# Patient Record
Sex: Male | Born: 1990
Health system: Southern US, Community
[De-identification: ages and names within clinical notes are randomized; demographics above are authoritative.]

## PROBLEM LIST (undated history)

## (undated) DIAGNOSIS — C73 Malignant neoplasm of thyroid gland: Secondary | ICD-10-CM

## (undated) HISTORY — PX: TONSILLECTOMY: SUR1361

## (undated) HISTORY — DX: Malignant neoplasm of thyroid gland: C73

## (undated) HISTORY — PX: TOTAL THYROIDECTOMY: SHX2547

---

## 2016-10-17 DIAGNOSIS — E039 Hypothyroidism, unspecified: Secondary | ICD-10-CM | POA: Diagnosis not present

## 2016-10-17 DIAGNOSIS — C73 Malignant neoplasm of thyroid gland: Secondary | ICD-10-CM | POA: Diagnosis not present

## 2016-10-18 DIAGNOSIS — C73 Malignant neoplasm of thyroid gland: Secondary | ICD-10-CM | POA: Diagnosis not present

## 2016-10-18 DIAGNOSIS — E039 Hypothyroidism, unspecified: Secondary | ICD-10-CM | POA: Diagnosis not present

## 2016-10-19 DIAGNOSIS — E039 Hypothyroidism, unspecified: Secondary | ICD-10-CM | POA: Diagnosis not present

## 2016-10-19 DIAGNOSIS — C73 Malignant neoplasm of thyroid gland: Secondary | ICD-10-CM | POA: Diagnosis not present

## 2016-10-21 DIAGNOSIS — E039 Hypothyroidism, unspecified: Secondary | ICD-10-CM | POA: Diagnosis not present

## 2016-10-21 DIAGNOSIS — C73 Malignant neoplasm of thyroid gland: Secondary | ICD-10-CM | POA: Diagnosis not present

## 2017-01-24 ENCOUNTER — Encounter: Payer: Self-pay | Admitting: "Endocrinology

## 2017-01-24 DIAGNOSIS — E890 Postprocedural hypothyroidism: Secondary | ICD-10-CM | POA: Diagnosis not present

## 2017-01-24 DIAGNOSIS — Z85850 Personal history of malignant neoplasm of thyroid: Secondary | ICD-10-CM | POA: Diagnosis not present

## 2018-02-27 ENCOUNTER — Encounter: Payer: Self-pay | Admitting: "Endocrinology

## 2018-02-27 ENCOUNTER — Ambulatory Visit (INDEPENDENT_AMBULATORY_CARE_PROVIDER_SITE_OTHER): Payer: BLUE CROSS/BLUE SHIELD | Admitting: "Endocrinology

## 2018-02-27 VITALS — BP 111/77 | HR 74 | Ht 75.0 in | Wt 236.0 lb

## 2018-02-27 DIAGNOSIS — Z85850 Personal history of malignant neoplasm of thyroid: Secondary | ICD-10-CM | POA: Insufficient documentation

## 2018-02-27 DIAGNOSIS — E890 Postprocedural hypothyroidism: Secondary | ICD-10-CM | POA: Diagnosis not present

## 2018-02-27 MED ORDER — LEVOTHYROXINE SODIUM 175 MCG PO TABS: 175.0000 ug | ORAL_TABLET | Freq: Every day | ORAL | 1 refills | Status: DC

## 2018-02-27 NOTE — Progress Notes (Signed)
Endocrinology Consult Note                                         02/27/2018, 3:22 PM   Barry Rose is a 28 y.o.-year-old male patient being seen in consultation for history of thyroid malignancy status post surgery and I-131 thyroid remnant ablation from 2016-2018, postsurgical hypothyroidism .  He is referred by his previous endocrinologist from [facility] due to his relocation to this area.  His referral package contains a report of his surgical history as follows:  " 1) papillary thyroid cancer: He was diagnosed with Graves' disease in 2016 and was found to have a thyroid nodule on the left superior pole as part of evaluation.  Fine-needle aspiration of this nodule was suspicious.  Papillary thyroid cancer. September 2016 he had a total thyroidectomy on September 23, 2014.  Pathology indicated that the left lobe had 1.5 cm papillary thyroid cancer; right lobe had a 0.1 cm Micro Focus of papillary thyroid cancer; 17/20 central neck lymph nodes had papillary thyroid cancer involvement.  The cancer was negative for BRAF mutation. - December 2016: He had I-123 uptake that were elevated at 16.4%, subsequent I-131 ablation was deferred. April 2017: On April 14, 2015, he had further surgery with resection of his residual right thyroid lobe.  Pathology was reported to be benign. - May 2017: On May 27, 2015, I 123 uptake was 1.7%.  He received Thyrogen for Thyrogen stimulated remnant ablation on the week of 06/05/2015 and received 50.4 mCi of I-131.    On June 12, 2015 he underwent a post remnant ablation whole-body scan that had focal uptake in the thyroid bed and involvement of his lingual thyroid tissue. - October 2018: In the week of October 17, 2016, he underwent Thyrogen stimulated whole-body scan done at an outside institute.  His maximum TSH was 47.7 on October 18, 2016 with a negative whole-body scan.  He did not have the  stimulated thyroglobulin level drawn."   On January 26, 2017 his thyroglobulin level was <0.1, along with thyroglobulin antibody <1.0.  For his postsurgical hypothyroidism, he is currently on levothyroxine 175 mcg p.o. every morning.  Patient reports compliance.  Does not have recent thyroid function test to review.  He did have evidence of overtreatment in January 2019 at which time he was taking levothyroxine 200 mcg p.o. nightly.  -Patient does not have any acute symptoms nor complaints today.  He does not feel any masses nor nodules in the neck.  He denies hoarseness, dysphagia, odynophagia.  He denies any family history of thyroid dysfunction nor thyroid malignancy.  Past Medical History:  Diagnosis Date  . Thyroid cancer [facility])    Past Surgical History:  Procedure Laterality Date  . TONSILLECTOMY    . TOTAL THYROIDECTOMY     Social History   Socioeconomic History  . Marital status: Married    Spouse name: Not on file  . Number of children: Not  on file  . Years of education: Not on file  . Highest education level: Not on file  Occupational History  . Not on file  Social Needs  . Financial resource strain: Not on file  . Food insecurity:    Worry: Not on file    Inability: Not on file  . Transportation needs:    Medical: Not on file    Non-medical: Not on file  Tobacco Use  . Smoking status: Never Smoker  . Smokeless tobacco: Never Used  Substance and Sexual Activity  . Alcohol use: Yes    Comment: Occassionally  . Drug use: Never  . Sexual activity: Not on file  Lifestyle  . Physical activity:    Days per week: Not on file    Minutes per session: Not on file  . Stress: Not on file  Relationships  . Social connections:    Talks on phone: Not on file    Gets together: Not on file    Attends religious service: Not on file    Active member of club or organization: Not on file    Attends meetings of clubs or organizations: Not on file    Relationship status: Not  on file  Other Topics Concern  . Not on file  Social History Narrative  . Not on file   Outpatient Encounter Medications as of 02/27/2018  Medication Sig  . levothyroxine (UNITHROID) 175 MCG tablet Take 1 tablet (175 mcg total) by mouth daily before breakfast.  . [DISCONTINUED] levothyroxine (UNITHROID) 175 MCG tablet Take 175 mcg by mouth daily before breakfast.   No facility-administered encounter medications on file as of 02/27/2018.    ALLERGIES: Allergies  Allergen Reactions  . Sulfa Antibiotics   . Vancomycin    VACCINATION STATUS:  There is no immunization history on file for this patient.    ROS:  Constitutional: no weight gain/loss, no fatigue, no subjective hyperthermia, no subjective hypothermia Eyes: no blurry vision, no xerophthalmia ENT: no sore throat, no nodules palpated in throat, no dysphagia/odynophagia, no hoarseness Cardiovascular: no Chest Pain, no Shortness of Breath, no palpitations, no leg swelling Respiratory: no cough, no SOB Gastrointestinal: no Nausea/Vomiting/Diarhhea Musculoskeletal: no muscle/joint aches Skin: no rashes Neurological: no tremors, no numbness, no tingling, no dizziness Psychiatric: no depression, no anxiety   Physical Exam: BP 111/77   Pulse 74   Ht _0  (1.905 m)   Wt 236 lb (107 kg)   BMI 29.50 kg/m  Wt Readings from Last 3 Encounters:  02/27/18 236 lb (107 kg)    Constitutional:  + Over weight for height, not in acute distress, normal state of mind Eyes: PERRLA, EOMI, no exophthalmos ENT: moist mucous membranes, + post thyroidectomy scar on anterior lower neck,  nocervical lymphadenopathy Cardiovascular: normal precordial activity, Regular Rate and Rhythm, no Murmur/Rubs/Gallops Respiratory:  adequate breathing efforts, no gross chest deformity, Clear to auscultation bilaterally Gastrointestinal: abdomen soft, Non -tender, No distension, Bowel Sounds present Musculoskeletal: no gross deformities, strength intact  in all four extremities Skin: moist, warm, no rashes Neurological: no tremor with outstretched hands, Deep tendon reflexes normal in all four extremities.  Labs from January 24, 2017: TSH 1.52, free T4 2-0 3 (normal 0.9 3-1.7); thyroglobulin level ,0.01, thyroglobulin antibodies  <1.0  October 21, 2016 Thyrogen stimulated whole-body scan was negative for tumor recurrence.  ASSESSMENT: 1.  Postsurgical hypothyroidism 2.  History of thyroid malignancy   PLAN:    Patient with history of thyroid malignancy status post staged surgery  in 2016, see above for details of his surgery and pathology from his referral package.  -We will attempt to obtain the original copies of his surgery and pathology from the various hospitals he was treated at.   -The need for periodic surveillance with imaging studies is emphasized to him and he agrees. -It is time to obtain thyroid/neck ultrasound, which I have ordered for him to obtain prior to his next visit.  -I will proceed to obtain new set of thyroid function tests including TSH, free T4, free T3.  For his postsurgical hypothyroidism, his current dose of levothyroxine 175 mcg seems to be adequate and appropriate dose. -Until his next labs, he is advised to continue the same.   - We discussed about the correct intake of his thyroid hormone, on empty stomach at fasting, with water, separated by at least 30 minutes from breakfast and other medications,  and separated by more than 4 hours from calcium, iron, multivitamins, acid reflux medications (PPIs). -Patient is made aware of the fact that thyroid hormone replacement is needed for life, dose to be adjusted by periodic monitoring of thyroid function tests.  - Time spent with the patient: 35 minutes, of which >50% was spent in obtaining information about his symptoms, reviewing his previous labs, evaluations, and treatments, counseling him about his postsurgical hypothyroidism, history of thyroid malignancy,  and developing a plan to confirm the diagnosis and long term treatment as necessary.  Leana Gamer participated in the discussions, expressed understanding, and voiced agreement with the above plans.  All questions were answered to his satisfaction. he is encouraged to contact clinic should he have any questions or concerns prior to his return visit.  Return in about 5 weeks (around 04/03/2018), or Obtain his original surgical report from [facility] , for Thyroid / Neck Ultrasound, Follow up with Pre-visit Labs.  Glade Lloyd, MD [facility] [address] [place], [place] [address] Phone: [phone]  Fax: [phone]   02/27/2018, 3:22 PM  This note was partially dictated with voice recognition software. Similar sounding words can be transcribed inadequately or may not  be corrected upon review.

## 2018-03-07 ENCOUNTER — Telehealth: Payer: Self-pay | Admitting: "Endocrinology

## 2018-03-07 NOTE — Telephone Encounter (Signed)
[facility] has attempted to contact pt several times. No answer and no response to emails.

## 2018-04-10 ENCOUNTER — Encounter: Payer: BLUE CROSS/BLUE SHIELD | Admitting: "Endocrinology

## 2018-09-19 ENCOUNTER — Telehealth: Payer: Self-pay | Admitting: "Endocrinology

## 2018-09-19 NOTE — Telephone Encounter (Signed)
He gets 30 days, needs labs and a visit

## 2018-09-25 NOTE — Telephone Encounter (Signed)
Lab order sent to [facility]. Pt had requested this earlier

## 2018-09-25 NOTE — Telephone Encounter (Signed)
Patient would like to go ahead and get his ultra sound. Please send to [facility]. He is advised no further refills until labs and appt.

## 2018-10-23 ENCOUNTER — Other Ambulatory Visit: Payer: Self-pay | Admitting: "Endocrinology

## 2018-10-31 ENCOUNTER — Encounter: Payer: Self-pay | Admitting: "Endocrinology

## 2018-10-31 ENCOUNTER — Ambulatory Visit (INDEPENDENT_AMBULATORY_CARE_PROVIDER_SITE_OTHER): Payer: BC Managed Care – PPO | Admitting: "Endocrinology

## 2018-10-31 ENCOUNTER — Other Ambulatory Visit: Payer: Self-pay

## 2018-10-31 DIAGNOSIS — Z85850 Personal history of malignant neoplasm of thyroid: Secondary | ICD-10-CM

## 2018-10-31 DIAGNOSIS — E890 Postprocedural hypothyroidism: Secondary | ICD-10-CM | POA: Diagnosis not present

## 2018-10-31 NOTE — Progress Notes (Signed)
10/31/2018, 6:56 PM                                Endocrinology Telehealth Visit Follow up Note -During COVID -19 Pandemic  I connected with Barry Rose on 10/31/2018   by telephone and verified that I am speaking with the correct person using two identifiers. Barry Rose, [birth year]. he has verbally consented to this visit. All issues noted in this document were discussed and addressed. The format was not optimal for physical exam.   Barry Rose is a 28 y.o.-year-old male patient being engaged in telehealth via telephone in follow-up after he was seen in referral from another endocrinology  for history of thyroid malignancy status post surgery and I-131 thyroid remnant ablation from 2016-2018, postsurgical hypothyroidism .  He is referred by his previous endocrinologist from [facility] due to his relocation to this area.  His referral package contains a report of his surgical history as follows:  " 1) papillary thyroid cancer: He was diagnosed with Graves' disease in 2016 and was found to have a thyroid nodule on the left superior pole as part of evaluation.  Fine-needle aspiration of this nodule was suspicious.  Papillary thyroid cancer. September 2016 he had a total thyroidectomy on September 23, 2014.  Pathology indicated that the left lobe had 1.5 cm papillary thyroid cancer; right lobe had a 0.1 cm Micro Focus of papillary thyroid cancer; 17/20 central neck lymph nodes had papillary thyroid cancer involvement.  The cancer was negative for BRAF mutation. - December 2016: He had I-123 uptake that were elevated at 16.4%, subsequent I-131 ablation was deferred. April 2017: On April 14, 2015, he had further surgery with resection of his residual right thyroid lobe.  Pathology was reported to be benign. - May 2017: On May 27, 2015, I 123 uptake was 1.7%.  He received  Thyrogen for Thyrogen stimulated remnant ablation on the week of 06/05/2015 and received 50.4 mCi of I-131.    On June 12, 2015 he underwent a post remnant ablation whole-body scan that had focal uptake in the thyroid bed and involvement of his lingual thyroid tissue. - October 2018: In the week of October 17, 2016, he underwent Thyrogen stimulated whole-body scan done at an outside institute.  His maximum TSH was 47.7 on October 18, 2016 with a negative whole-body scan.  He did not have the stimulated thyroglobulin level drawn."  He underwent thyroid/neck ultrasound on October 02, 2018 in [place]  with no significant sonographic findings.    On January 26, 2017 his thyroglobulin level was <0.1, along with thyroglobulin antibody <1.0.  For his postsurgical hypothyroidism, he is currently on levothyroxine 175 mcg p.o. every morning.  Patient reports compliance.  He did not do his previsit  thyroid function test.  -Patient does not have any acute symptoms nor complaints today.  He does not feel any masses nor nodules in the neck.  He denies hoarseness, dysphagia, odynophagia.  He denies any family history of thyroid dysfunction nor thyroid malignancy.  Past Medical History:  Diagnosis Date  . Thyroid cancer [facility])    Past Surgical History:  Procedure Laterality Date  . TONSILLECTOMY    . TOTAL THYROIDECTOMY     Social History   Socioeconomic History  . Marital status: Married    Spouse name: Not on file  . Number of children: Not on file  . Years of education: Not on file  . Highest education level: Not on file  Occupational History  . Not on file  Social Needs  . Financial resource strain: Not on file  . Food insecurity    Worry: Not on file    Inability: Not on file  . Transportation needs    Medical: Not on file    Non-medical: Not on file  Tobacco Use  . Smoking status: Never Smoker  . Smokeless tobacco: Never Used  Substance and Sexual Activity  . Alcohol use:  Yes    Comment: Occassionally  . Drug use: Never  . Sexual activity: Not on file  Lifestyle  . Physical activity    Days per week: Not on file    Minutes per session: Not on file  . Stress: Not on file  Relationships  . Social Herbalist on phone: Not on file    Gets together: Not on file    Attends religious service: Not on file    Active member of club or organization: Not on file    Attends meetings of clubs or organizations: Not on file    Relationship status: Not on file  Other Topics Concern  . Not on file  Social History Narrative  . Not on file   Outpatient Encounter Medications as of 10/31/2018  Medication Sig  . levothyroxine (SYNTHROID) 175 MCG tablet TAKE 1 TABLET BY MOUTH DAILY BEFORE BREAKFAST   No facility-administered encounter medications on file as of 10/31/2018.    ALLERGIES: Allergies  Allergen Reactions  . Sulfa Antibiotics   . Vancomycin    VACCINATION STATUS:  There is no immunization history on file for this patient.    ROS: Limited as above.  Physical Exam: There were no vitals taken for this visit. Wt Readings from Last 3 Encounters:  02/27/18 236 lb (107 kg)     Labs from January 24, 2017: TSH 1.52, free T4 2-0 3 (normal 0.9 3-1.7); thyroglobulin level ,0.01, thyroglobulin antibodies  <1.0  October 21, 2016 Thyrogen stimulated whole-body scan was negative for tumor recurrence.  ASSESSMENT: 1.  Postsurgical hypothyroidism 2.  History of thyroid malignancy   Thyroid/neck ultrasound on October 02, 2018: There is no residual thyroid tissue noted within the thyroid bed.  No suspicious masses are evident by ultrasound.  No significant sonographic findings. PLAN:    Patient with history of thyroid malignancy status post staged surgery in 2016, see above for details of his surgery and pathology from his referral package.  -We will attempt to obtain the original copies of his surgery and pathology from the various hospitals  he was treated at.   -The need for periodic surveillance with imaging studies is emphasized to him and he agrees. -His recent  thyroid/neck ultrasound is negative for residual thyroid tissue or suspicious masses.  He will be  considered for Thyrogen stimulated whole-body scan next year.   -He no showed for several visits, the need for strict follow-up for surveillance is emphasized with him.   For his postsurgical hypothyroidism, his current dose of levothyroxine 175 mcg seems to be adequate and appropriate dose. -He is urged to get a new set of thyroid function tests in the next few days, and prior to his next visit in 6 months.  -Until his next labs, he is advised to continue the same.   - We discussed about the correct intake of his thyroid hormone, on empty stomach at fasting, with water, separated by at least 30 minutes from breakfast and other medications,  and separated by more than 4 hours from calcium, iron, multivitamins, acid reflux medications (PPIs). -Patient is made aware of the fact that thyroid hormone replacement is needed for life, dose to be adjusted by periodic monitoring of thyroid function tests.    Time for this visit: 25 minutes. Barry Rose  participated in the discussions, expressed understanding, and voiced agreement with the above plans.  All questions were answered to his satisfaction. he is encouraged to contact clinic should he have any questions or concerns prior to his return visit.   Return in about 6 months (around 05/01/2019), or labs now, and B4 next visit, for Follow up with Pre-visit Labs.  Glade Lloyd, MD [facility] [address] [place], [place] [address] Phone: [phone]  Fax: [phone]   10/31/2018, 6:56 PM  This note was partially dictated with voice recognition software. Similar sounding words can be transcribed inadequately or may not  be corrected upon review.

## 2018-11-30 ENCOUNTER — Other Ambulatory Visit: Payer: Self-pay | Admitting: "Endocrinology

## 2018-11-30 NOTE — Telephone Encounter (Signed)
Pt is requesting 90 day supply on his thyroid medication.

## 2019-01-01 ENCOUNTER — Other Ambulatory Visit: Payer: Self-pay | Admitting: "Endocrinology

## 2019-01-21 ENCOUNTER — Other Ambulatory Visit: Payer: Self-pay

## 2019-01-21 MED ORDER — LEVOTHYROXINE SODIUM 175 MCG PO TABS: 175.0000 ug | ORAL_TABLET | Freq: Every day | ORAL | 1 refills | Status: DC

## 2019-04-04 ENCOUNTER — Other Ambulatory Visit: Payer: Self-pay | Admitting: "Endocrinology

## 2019-05-02 ENCOUNTER — Ambulatory Visit: Payer: BC Managed Care – PPO | Admitting: "Endocrinology

## 2019-10-21 ENCOUNTER — Telehealth: Payer: Self-pay

## 2019-10-21 NOTE — Telephone Encounter (Signed)
Please advise 

## 2019-10-21 NOTE — Telephone Encounter (Signed)
Patient is asking for a refill on thyroid medication. Last ov was 10/2018. He was mailed a lab order today and will call back for an appt

## 2019-10-22 ENCOUNTER — Other Ambulatory Visit: Payer: Self-pay

## 2019-10-22 DIAGNOSIS — E890 Postprocedural hypothyroidism: Secondary | ICD-10-CM

## 2019-10-22 MED ORDER — LEVOTHYROXINE SODIUM 175 MCG PO TABS: ORAL_TABLET | ORAL | 0 refills | Status: DC

## 2019-10-22 NOTE — Telephone Encounter (Signed)
One month supply until we know his labs and a visit.

## 2019-10-22 NOTE — Telephone Encounter (Signed)
Sent in for 30 day supply

## 2019-11-21 ENCOUNTER — Other Ambulatory Visit (HOSPITAL_COMMUNITY)
Admission: RE | Admit: 2019-11-21 | Discharge: 2019-11-21 | Disposition: A | Discharge status: Home / Self Care | Payer: BC Managed Care – PPO | Source: Ambulatory Visit | Attending: "Endocrinology | Admitting: "Endocrinology

## 2019-11-21 DIAGNOSIS — E890 Postprocedural hypothyroidism: Secondary | ICD-10-CM | POA: Diagnosis present

## 2019-11-21 LAB — T4, FREE: Free T4: 1.33 ng/dL — ABNORMAL HIGH (ref 0.61–1.12)

## 2019-11-21 LAB — TSH: TSH: 1.26 u[IU]/mL (ref 0.350–4.500)

## 2019-11-25 ENCOUNTER — Other Ambulatory Visit: Payer: Self-pay

## 2019-11-25 DIAGNOSIS — E890 Postprocedural hypothyroidism: Secondary | ICD-10-CM

## 2019-11-25 MED ORDER — LEVOTHYROXINE SODIUM 175 MCG PO TABS: ORAL_TABLET | ORAL | 0 refills | Status: DC

## 2019-11-25 NOTE — Telephone Encounter (Signed)
Sent in

## 2019-11-25 NOTE — Telephone Encounter (Signed)
Pt asking for a refill. He did labs and will have an appt Friday 11/19. Advised pt he has to keep appt.

## 2019-11-27 ENCOUNTER — Other Ambulatory Visit: Payer: Self-pay | Admitting: "Endocrinology

## 2019-11-27 DIAGNOSIS — E890 Postprocedural hypothyroidism: Secondary | ICD-10-CM

## 2019-11-29 ENCOUNTER — Ambulatory Visit (INDEPENDENT_AMBULATORY_CARE_PROVIDER_SITE_OTHER): Payer: BC Managed Care – PPO | Admitting: "Endocrinology

## 2019-11-29 ENCOUNTER — Other Ambulatory Visit: Payer: Self-pay

## 2019-11-29 ENCOUNTER — Encounter: Payer: Self-pay | Admitting: "Endocrinology

## 2019-11-29 VITALS — BP 110/78 | HR 72 | Ht 72.0 in | Wt 239.6 lb

## 2019-11-29 DIAGNOSIS — E890 Postprocedural hypothyroidism: Secondary | ICD-10-CM

## 2019-11-29 DIAGNOSIS — Z85850 Personal history of malignant neoplasm of thyroid: Secondary | ICD-10-CM | POA: Diagnosis not present

## 2019-11-29 MED ORDER — LEVOTHYROXINE SODIUM 175 MCG PO TABS: ORAL_TABLET | ORAL | 1 refills | Status: DC

## 2019-11-29 NOTE — Progress Notes (Signed)
11/29/2019, 1:05 PM    Endocrinology follow-up note    Barry Rose is a 29 y.o.-year-old male patient being seen in follow-up after he was seen in referral from another endocrinologist  for history of thyroid malignancy status post surgery and I-131 thyroid remnant ablation from 2016-2018, postsurgical hypothyroidism .  He is referred by his previous endocrinologist from [facility] due to his relocation to this area.  His referral package contains a report of his surgical history as follows:  " 1) papillary thyroid cancer: He was diagnosed with Graves' disease in 2016 and was found to have a thyroid nodule on the left superior pole as part of evaluation.  Fine-needle aspiration of this nodule was suspicious.  Papillary thyroid cancer. September 2016 he had a total thyroidectomy on September 23, 2014.  Pathology indicated that the left lobe had 1.5 cm papillary thyroid cancer; right lobe had a 0.1 cm Micro Focus of papillary thyroid cancer; 17/20 central neck lymph nodes had papillary thyroid cancer involvement.  The cancer was negative for BRAF mutation. - December 2016: He had I-123 uptake that were elevated at 16.4%, subsequent I-131 ablation was deferred. April 2017: On April 14, 2015, he had further surgery with resection of his residual right thyroid lobe.  Pathology was reported to be benign. - May 2017: On May 27, 2015, I 123 uptake was 1.7%.  He received Thyrogen for Thyrogen stimulated remnant ablation on the week of 06/05/2015 and received 50.4 mCi of I-131.    On June 12, 2015 he underwent a post remnant ablation whole-body scan that had focal uptake in the thyroid bed and involvement of his lingual thyroid tissue. - October 2018: In the week of October 17, 2016, he underwent Thyrogen stimulated whole-body scan done at an outside institute.  His maximum TSH was 47.7 on  October 18, 2016 with a negative whole-body scan.  He did not have the stimulated thyroglobulin level drawn."  He underwent thyroid/neck ultrasound on October 02, 2018 in [place]  with no significant sonographic findings. -He presents with thyroid function test consistent with appropriate suppressive treatment.    On January 26, 2017 his thyroglobulin level was <0.1, along with thyroglobulin antibody <1.0.  For his postsurgical hypothyroidism, he is currently on levothyroxine 175 mcg p.o. every morning.  He reports compliance to his hormone replacement treatment, does not keep his appointment.     -Patient does not have any acute symptoms nor complaints today.  He does not feel any masses nor nodules in the neck.  He denies hoarseness, dysphagia, odynophagia.  He denies any family history of thyroid dysfunction nor thyroid malignancy.  Past Medical History:  Diagnosis Date  . Thyroid cancer [facility])    Past Surgical History:  Procedure Laterality Date  . TONSILLECTOMY    . TOTAL THYROIDECTOMY  Social History   Socioeconomic History  . Marital status: Married    Spouse name: Not on file  . Number of children: Not on file  . Years of education: Not on file  . Highest education level: Not on file  Occupational History  . Not on file  Tobacco Use  . Smoking status: Never Smoker  . Smokeless tobacco: Never Used  Vaping Use  . Vaping Use: Never used  Substance and Sexual Activity  . Alcohol use: Yes    Comment: Occassionally  . Drug use: Never  . Sexual activity: Not on file  Other Topics Concern  . Not on file  Social History Narrative  . Not on file   Social Determinants of Health   Financial Resource Strain:   . Difficulty of Paying Living Expenses: Not on file  Food Insecurity:   . Worried About Charity fundraiser in the Last Year: Not on file  . Ran Out of Food in the Last Year: Not on file  Transportation Needs:   . Lack of Transportation (Medical):  Not on file  . Lack of Transportation (Non-Medical): Not on file  Physical Activity:   . Days of Exercise per Week: Not on file  . Minutes of Exercise per Session: Not on file  Stress:   . Feeling of Stress : Not on file  Social Connections:   . Frequency of Communication with Friends and Family: Not on file  . Frequency of Social Gatherings with Friends and Family: Not on file  . Attends Religious Services: Not on file  . Active Member of Clubs or Organizations: Not on file  . Attends Archivist Meetings: Not on file  . Marital Status: Not on file   Outpatient Encounter Medications as of 11/29/2019  Medication Sig  . levothyroxine (SYNTHROID) 175 MCG tablet TAKE 1 TABLET BY MOUTH DAILY BEFORE BREAKFAST  . [DISCONTINUED] levothyroxine (SYNTHROID) 175 MCG tablet TAKE 1 TABLET BY MOUTH DAILY BEFORE BREAKFAST   No facility-administered encounter medications on file as of 11/29/2019.   ALLERGIES: Allergies  Allergen Reactions  . Sulfa Antibiotics   . Vancomycin    VACCINATION STATUS:  There is no immunization history on file for this patient.    ROS: Limited as above.  Physical Exam: BP 110/78   Pulse 72   Ht 6' (1.829 m)   Wt 239 lb 9.6 oz (108.7 kg)   BMI 32.50 kg/m  Wt Readings from Last 3 Encounters:  11/29/19 239 lb 9.6 oz (108.7 kg)  02/27/18 236 lb (107 kg)     Labs from January 24, 2017: TSH 1.52, free T4 2-0 3 (normal 0.9 3-1.7); thyroglobulin level ,0.01, thyroglobulin antibodies  <1.0  October 21, 2016 Thyrogen stimulated whole-body scan was negative for tumor recurrence.  ASSESSMENT: 1.  Postsurgical hypothyroidism 2.  History of thyroid malignancy   Thyroid/neck ultrasound on October 02, 2018: There is no residual thyroid tissue noted within the thyroid bed.  No suspicious masses are evident by ultrasound.  No significant sonographic findings. PLAN:    Patient with history of thyroid malignancy status post staged surgery in 2016, see  above for details of his surgery and pathology from his referral package.  -We will attempt to obtain the original copies of his surgery and pathology from the various hospitals he was treated at.   -The need for periodic surveillance with imaging studies is emphasized to him and he agrees. -His recent  thyroid/neck ultrasound is negative for residual thyroid  tissue or suspicious masses.   He will be considered for Thyrogen stimulated whole-body scan before his next visit in 6 months.  Next year.   -He no showed for several visits, the need for strict follow-up for surveillance is emphasized with him.   For his postsurgical hypothyroidism, his current dose of levothyroxine 175 mcg seems to be adequate and appropriate dose. -His previsit thyroid function tests are consistent with appropriate suppressive treatment. -He is urged to get a new set of thyroid function tests before his next visit in 6 months.     - We discussed about the correct intake of his thyroid hormone, on empty stomach at fasting, with water, separated by at least 30 minutes from breakfast and other medications,  and separated by more than 4 hours from calcium, iron, multivitamins, acid reflux medications (PPIs). -Patient is made aware of the fact that thyroid hormone replacement is needed for life, dose to be adjusted by periodic monitoring of thyroid function tests.  -He is advised to identify primary care provider in the area.      - Time spent on this patient care encounter:  20 minutes of which 50% was spent in  counseling and the rest reviewing  his current and  previous labs / studies and medications  doses and developing a plan for long term care. Leana Gamer  participated in the discussions, expressed understanding, and voiced agreement with the above plans.  All questions were answered to his satisfaction. he is encouraged to contact clinic should he have any questions or concerns prior to his return  visit.    Return in about 6 months (around 05/28/2020) for F/U with Pre-visit Labs, F/U with Whole Body Scan w/Thyrogen.  Glade Lloyd, MD [facility] [address] [place], [place] [address] Phone: [phone]  Fax: [phone]   11/29/2019, 1:05 PM  This note was partially dictated with voice recognition software. Similar sounding words can be transcribed inadequately or may not  be corrected upon review.

## 2020-05-13 ENCOUNTER — Encounter (HOSPITAL_COMMUNITY)
Admission: RE | Admit: 2020-05-13 | Discharge: 2020-05-13 | Disposition: A | Discharge status: Home / Self Care | Payer: BC Managed Care – PPO | Source: Ambulatory Visit | Attending: "Endocrinology | Admitting: "Endocrinology

## 2020-05-13 ENCOUNTER — Encounter (HOSPITAL_COMMUNITY): Payer: Self-pay

## 2020-05-13 DIAGNOSIS — Z85850 Personal history of malignant neoplasm of thyroid: Secondary | ICD-10-CM | POA: Diagnosis present

## 2020-05-13 DIAGNOSIS — E890 Postprocedural hypothyroidism: Secondary | ICD-10-CM | POA: Diagnosis not present

## 2020-05-13 MED ORDER — STERILE WATER FOR INJECTION IJ SOLN
INTRAMUSCULAR | Status: AC
  Administered 2020-05-13: 1 mL
  Filled 2020-05-13: qty 10

## 2020-05-13 MED ORDER — THYROTROPIN ALFA 0.9 MG IM SOLR
0.9000 mg | INTRAMUSCULAR | Status: AC
  Administered 2020-05-13: 0.9 mg via INTRAMUSCULAR

## 2020-05-13 MED ORDER — THYROTROPIN ALFA 0.9 MG IM SOLR
INTRAMUSCULAR | Status: AC
  Filled 2020-05-13: qty 0.9

## 2020-05-14 ENCOUNTER — Encounter (HOSPITAL_COMMUNITY)
Admission: RE | Admit: 2020-05-14 | Discharge: 2020-05-14 | Disposition: A | Discharge status: Home / Self Care | Payer: BC Managed Care – PPO | Source: Ambulatory Visit | Attending: "Endocrinology | Admitting: "Endocrinology

## 2020-05-14 DIAGNOSIS — E890 Postprocedural hypothyroidism: Secondary | ICD-10-CM | POA: Diagnosis not present

## 2020-05-14 DIAGNOSIS — Z85850 Personal history of malignant neoplasm of thyroid: Secondary | ICD-10-CM

## 2020-05-14 MED ORDER — THYROTROPIN ALFA 0.9 MG IM SOLR: 0.9000 mg | INTRAMUSCULAR | Status: AC

## 2020-05-14 MED ORDER — STERILE WATER FOR INJECTION IJ SOLN
INTRAMUSCULAR | Status: AC
  Administered 2020-05-14: 1 mL
  Filled 2020-05-14: qty 10

## 2020-05-14 MED ORDER — THYROTROPIN ALFA 0.9 MG IM SOLR
INTRAMUSCULAR | Status: AC
  Administered 2020-05-14: 0.9 mg via INTRAMUSCULAR
  Filled 2020-05-14: qty 0.9

## 2020-05-15 ENCOUNTER — Other Ambulatory Visit: Payer: Self-pay

## 2020-05-15 ENCOUNTER — Other Ambulatory Visit (HOSPITAL_COMMUNITY)
Admission: RE | Admit: 2020-05-15 | Discharge: 2020-05-15 | Disposition: A | Discharge status: Home / Self Care | Payer: BC Managed Care – PPO | Source: Ambulatory Visit | Attending: "Endocrinology | Admitting: "Endocrinology

## 2020-05-15 ENCOUNTER — Encounter (HOSPITAL_COMMUNITY)
Admission: RE | Admit: 2020-05-15 | Discharge: 2020-05-15 | Disposition: A | Discharge status: Home / Self Care | Payer: BC Managed Care – PPO | Source: Ambulatory Visit | Attending: "Endocrinology | Admitting: "Endocrinology

## 2020-05-15 ENCOUNTER — Encounter (HOSPITAL_COMMUNITY): Payer: Self-pay

## 2020-05-15 DIAGNOSIS — E890 Postprocedural hypothyroidism: Secondary | ICD-10-CM | POA: Diagnosis present

## 2020-05-15 DIAGNOSIS — Z85850 Personal history of malignant neoplasm of thyroid: Secondary | ICD-10-CM | POA: Insufficient documentation

## 2020-05-15 LAB — TSH: TSH: 89.367 u[IU]/mL — ABNORMAL HIGH (ref 0.350–4.500)

## 2020-05-15 LAB — T4, FREE: Free T4: 1.49 ng/dL — ABNORMAL HIGH (ref 0.61–1.12)

## 2020-05-15 MED ORDER — SODIUM IODIDE I 131 CAPSULE
4.0000 | Freq: Once | INTRAVENOUS | Status: AC | PRN
  Administered 2020-05-15: 4.15 via ORAL

## 2020-05-16 LAB — THYROGLOBULIN ANTIBODY: Thyroglobulin Antibody: <1 IU/mL (ref 0.0–0.9)

## 2020-05-18 ENCOUNTER — Encounter (HOSPITAL_COMMUNITY)
Admission: RE | Admit: 2020-05-18 | Discharge: 2020-05-18 | Disposition: A | Discharge status: Home / Self Care | Payer: BC Managed Care – PPO | Source: Ambulatory Visit | Attending: "Endocrinology | Admitting: "Endocrinology

## 2020-05-18 IMAGING — NM NM [ID] THYROID CANCER METS WHOLE BODY W/ THYROGEN
3 series · 3 of 3 positions shown · non-contrast
Comparison: None.

CLINICAL DATA: Thyroid cancer surveillance. Post thyroidectomy. The
thyroidectomy [LP] a papillary thyroid carcinoma. Thyroid remnant
ablation with 15 millicuries [DATE].

EXAM:
THYROGEN-STIMULATED [LP] WHOLE BODY SCAN
TECHNIQUE: The patient received 0.9 mg Thyrogen intramuscularly every 24 hours
for two doses. On the third day the patient returned and received
the radiopharmaceutical, per orally. On the fifth day, the patient
returned and whole body planar images were obtained in the anterior
and posterior projections.
RADIOPHARMACEUTICALS:  4.1 mCi [LP] sodium iodide orally

[Series 1: i131 whole body · 2.66mm/px · 1 of 1 slices shown (1 of 2)]
[im 1/1  full-range]
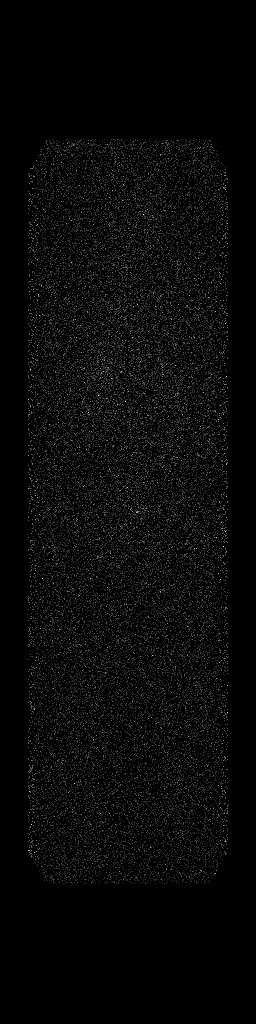

[Series 1: i131 whole body · 2.66mm/px · 1 of 1 slices shown (2 of 2)]
[im 1/1  full-range]
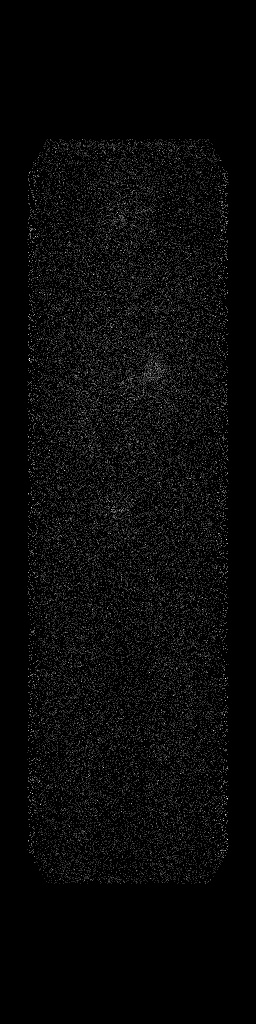

[Series 2: static with marker · 4.14mm/px · 1 of 1 slices shown]
[im 1/1]
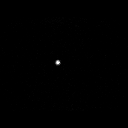

[3 of 3 positions shown; findings below may reference images not displayed]

FINDINGS: No a focal activity in the thyroid bed. No evidence abnormal
radiotracer activity outside the thyroid bed on whole-body scan.
Physiologic activity noted in the stomach and salivary glands.
IMPRESSION: No evidence of I 131 avid local thyroid cancer recurrence or distant
metastasis.

## 2020-05-24 LAB — THYROGLOBULIN LEVEL: Thyroglobulin: <2 ng/mL

## 2020-05-28 ENCOUNTER — Other Ambulatory Visit: Payer: Self-pay

## 2020-05-28 ENCOUNTER — Ambulatory Visit: Payer: BC Managed Care – PPO | Admitting: "Endocrinology

## 2020-05-28 ENCOUNTER — Encounter: Payer: Self-pay | Admitting: "Endocrinology

## 2020-05-28 VITALS — BP 117/83 | HR 71 | Ht 72.0 in | Wt 235.0 lb

## 2020-05-28 DIAGNOSIS — Z85850 Personal history of malignant neoplasm of thyroid: Secondary | ICD-10-CM | POA: Diagnosis not present

## 2020-05-28 DIAGNOSIS — E890 Postprocedural hypothyroidism: Secondary | ICD-10-CM | POA: Diagnosis not present

## 2020-05-28 MED ORDER — LEVOTHYROXINE SODIUM 175 MCG PO TABS: ORAL_TABLET | ORAL | 1 refills | Status: DC

## 2020-05-28 NOTE — Progress Notes (Signed)
05/28/2020, 9:36 PM    Endocrinology follow-up note    Barry Rose is a 30 y.o.-year-old male patient being seen in follow-up after he was seen in referral from another endocrinologist  for history of thyroid malignancy status post surgery and I-131 thyroid remnant ablation from 2016-2018, postsurgical hypothyroidism .  He was originally referred by his previous endocrinologist from [facility] due to his relocation to this area.  His referral package contains a report of his surgical history as follows:  " 1) papillary thyroid cancer: He was diagnosed with Graves' disease in 2016 and was found to have a thyroid nodule on the left superior pole as part of evaluation.  Fine-needle aspiration of this nodule was suspicious.  Papillary thyroid cancer. September 2016 he had a total thyroidectomy on September 23, 2014.  Pathology indicated that the left lobe had 1.5 cm papillary thyroid cancer; right lobe had a 0.1 cm Micro Focus of papillary thyroid cancer; 17/20 central neck lymph nodes had papillary thyroid cancer involvement.  The cancer was negative for BRAF mutation. - December 2016: He had I-123 uptake that were elevated at 16.4%, subsequent I-131 ablation was deferred. April 2017: On April 14, 2015, he had further surgery with resection of his residual right thyroid lobe.  Pathology was reported to be benign. - May 2017: On May 27, 2015, I 123 uptake was 1.7%.  He received Thyrogen for Thyrogen stimulated remnant ablation on the week of 06/05/2015 and received 50.4 mCi of I-131.    On June 12, 2015 he underwent a post remnant ablation whole-body scan that had focal uptake in the thyroid bed and involvement of his lingual thyroid tissue. - October 2018: In the week of October 17, 2016, he underwent Thyrogen stimulated whole-body scan done at an outside institute.  His maximum TSH  was 47.7 on October 18, 2016 with a negative whole-body scan.  He did not have the stimulated thyroglobulin level drawn."  He underwent thyroid/neck ultrasound on October 02, 2018 in [place]  with no significant sonographic findings. -He presents with thyroid function test consistent with appropriate suppressive treatment.  His most recent Thyrogen stimulated whole-body scan on May 18, 2020 was significant for absence of tumor recurrence or distant metastasis. Thyroglobulin level was under 2 on May 15, 2020 associated with undetectable thyroglobulin bodies.  For his postsurgical hypothyroidism, he is currently on levothyroxine 175 mcg p.o. every morning.  He reports compliance to his hormone replacement treatment.  He has history of consistency and keeping his appointments.  His previsit labs show high free T4 and high TSH.    -Patient does not have any acute symptoms nor complaints today.  He does not feel any masses nor nodules in the neck.  He denies hoarseness, dysphagia, odynophagia.  He denies any family history of thyroid dysfunction nor  thyroid malignancy.  Past Medical History:  Diagnosis Date  . Thyroid cancer [facility])    Past Surgical History:  Procedure Laterality Date  . TONSILLECTOMY    . TOTAL THYROIDECTOMY     Social History   Socioeconomic History  . Marital status: Married    Spouse name: Not on file  . Number of children: Not on file  . Years of education: Not on file  . Highest education level: Not on file  Occupational History  . Not on file  Tobacco Use  . Smoking status: Never Smoker  . Smokeless tobacco: Never Used  Vaping Use  . Vaping Use: Never used  Substance and Sexual Activity  . Alcohol use: Yes    Comment: Occassionally  . Drug use: Never  . Sexual activity: Not on file  Other Topics Concern  . Not on file  Social History Narrative  . Not on file   Social Determinants of Health   Financial Resource Strain: Not on file  Food  Insecurity: Not on file  Transportation Needs: Not on file  Physical Activity: Not on file  Stress: Not on file  Social Connections: Not on file   Outpatient Encounter Medications as of 05/28/2020  Medication Sig  . cetirizine (ZYRTEC) 10 MG tablet Take 10 mg by mouth daily.  Marland Kitchen levothyroxine (SYNTHROID) 175 MCG tablet TAKE 1 TABLET BY MOUTH DAILY BEFORE BREAKFAST  . [DISCONTINUED] levothyroxine (SYNTHROID) 175 MCG tablet TAKE 1 TABLET BY MOUTH DAILY BEFORE BREAKFAST   No facility-administered encounter medications on file as of 05/28/2020.   ALLERGIES: Allergies  Allergen Reactions  . Sulfa Antibiotics   . Vancomycin    VACCINATION STATUS:  There is no immunization history on file for this patient.    ROS: Limited as above.  Physical Exam: BP 117/83   Pulse 71   Ht 6' (1.829 m)   Wt 235 lb (106.6 kg)   BMI 31.87 kg/m  Wt Readings from Last 3 Encounters:  05/28/20 235 lb (106.6 kg)  11/29/19 239 lb 9.6 oz (108.7 kg)  02/27/18 236 lb (107 kg)     Labs from January 24, 2017: TSH 1.52, free T4 2-0 3 (normal 0.9 3-1.7); thyroglobulin level ,0.01, thyroglobulin antibodies  <1.0  October 21, 2016 Thyrogen stimulated whole-body scan was negative for tumor recurrence.  ASSESSMENT: 1.  Postsurgical hypothyroidism 2.  History of thyroid malignancy   Thyroid/neck ultrasound on October 02, 2018: There is no residual thyroid tissue noted within the thyroid bed.  No suspicious masses are evident by ultrasound.  No significant sonographic findings. PLAN:    Patient with history of thyroid malignancy status post staged surgery in 2016, see above for details of his surgery and pathology from his referral package.  -We will attempt to obtain the original copies of his surgery and pathology from the various hospitals he was treated at.   -The need for periodic surveillance with imaging studies is emphasized to him and he agrees. -His recent  thyroid/neck ultrasound is negative  for residual thyroid tissue or suspicious masses.  -His previsit thyroid estimated whole-body scan is negative for tumor recurrence or distant metastasis.  His thyroglobulin levels are undetectable along with undetectable thyroglobulin bodies.  He has a habit of not showing for his appointments.  The need for strict follow-up for surveillance is emphasized 1 more time for him.   For his postsurgical hypothyroidism, his current dose of levothyroxine 175 mcg seems to be adequate and appropriate dose. -His previsit thyroid function  tests are showing inconsistent intake with high free T4 and high TSH.  He will benefit from staying on the same dose of levothyroxine 175 mcg p.o. daily before breakfast.    - We discussed about the correct intake of his thyroid hormone, on empty stomach at fasting, with water, separated by at least 30 minutes from breakfast and other medications,  and separated by more than 4 hours from calcium, iron, multivitamins, acid reflux medications (PPIs). -Patient is made aware of the fact that thyroid hormone replacement is needed for life, dose to be adjusted by periodic monitoring of thyroid function tests. -He is advised to identify primary care provider in the area.    I spent 31 minutes in the care of the patient today including review of labs from Thyroid Function,  and other relevant labs ; nuclear medicine whole-body scan imaging/biopsy records (current and previous including abstractions from other facilities); face-to-face time discussing  his lab results and symptoms, medications doses, his options of short and long term treatment based on the latest standards of care / guidelines;   and documenting the encounter.  Leana Gamer  participated in the discussions, expressed understanding, and voiced agreement with the above plans.  All questions were answered to his satisfaction. he is encouraged to contact clinic should he have any questions or concerns prior to his  return visit.    Return in about 6 months (around 11/28/2020) for F/U with Pre-visit Labs.  Glade Lloyd, MD [facility] [address] [place], [place] [address] Phone: [phone]  Fax: [phone]   05/28/2020, 9:36 PM  This note was partially dictated with voice recognition software. Similar sounding words can be transcribed inadequately or may not  be corrected upon review.

## 2020-11-23 ENCOUNTER — Other Ambulatory Visit (HOSPITAL_COMMUNITY)
Admission: RE | Admit: 2020-11-23 | Discharge: 2020-11-23 | Disposition: A | Discharge status: Home / Self Care | Payer: BC Managed Care – PPO | Source: Ambulatory Visit | Attending: "Endocrinology | Admitting: "Endocrinology

## 2020-11-23 DIAGNOSIS — E890 Postprocedural hypothyroidism: Secondary | ICD-10-CM | POA: Diagnosis not present

## 2020-11-23 LAB — T4, FREE: Free T4: 1.09 ng/dL (ref 0.61–1.12)

## 2020-11-23 LAB — TSH: TSH: 0.768 u[IU]/mL (ref 0.350–4.500)

## 2020-12-09 ENCOUNTER — Other Ambulatory Visit: Payer: Self-pay

## 2020-12-09 ENCOUNTER — Encounter: Payer: Self-pay | Admitting: "Endocrinology

## 2020-12-09 ENCOUNTER — Ambulatory Visit: Payer: BC Managed Care – PPO | Admitting: "Endocrinology

## 2020-12-09 VITALS — BP 124/72 | HR 76 | Ht 72.0 in | Wt 238.0 lb

## 2020-12-09 DIAGNOSIS — Z85850 Personal history of malignant neoplasm of thyroid: Secondary | ICD-10-CM

## 2020-12-09 DIAGNOSIS — E890 Postprocedural hypothyroidism: Secondary | ICD-10-CM

## 2020-12-09 MED ORDER — LEVOTHYROXINE SODIUM 175 MCG PO TABS
ORAL_TABLET | ORAL | 1 refills | Status: DC
Start: 2020-12-09 — End: 2021-06-21

## 2020-12-09 NOTE — Progress Notes (Signed)
12/09/2020, 9:08 AM    Endocrinology follow-up note    Barry Rose is a 30 y.o.-year-old male patient being seen in follow-up after he was seen in referral from another endocrinologist  for history of thyroid malignancy status post surgery and I-131 thyroid remnant ablation from 2016-2018, postsurgical hypothyroidism .  He was originally referred by his previous endocrinologist from [facility] due to his relocation to this area.  His referral package contains a report of his surgical history as follows:  " 1) papillary thyroid cancer: He was diagnosed with Graves' disease in 2016 and was found to have a thyroid nodule on the left superior pole as part of evaluation.  Fine-needle aspiration of this nodule was suspicious.  Papillary thyroid cancer. September 2016 he had a total thyroidectomy on September 23, 2014.  Pathology indicated that the left lobe had 1.5 cm papillary thyroid cancer; right lobe had a 0.1 cm Micro Focus of papillary thyroid cancer; 17/20 central neck lymph nodes had papillary thyroid cancer involvement.  The cancer was negative for BRAF mutation. - December 2016: He had I-123 uptake that were elevated at 16.4%, subsequent I-131 ablation was deferred. April 2017: On April 14, 2015, he had further surgery with resection of his residual right thyroid lobe.  Pathology was reported to be benign. - May 2017: On May 27, 2015, I 123 uptake was 1.7%.  He received Thyrogen for Thyrogen stimulated remnant ablation on the week of 06/05/2015 and received 50.4 mCi of I-131.    On June 12, 2015 he underwent a post remnant ablation whole-body scan that had focal uptake in the thyroid bed and involvement of his lingual thyroid tissue. - October 2018: In the week of October 17, 2016, he underwent Thyrogen stimulated whole-body scan done at an outside institute.  His maximum TSH  was 47.7 on October 18, 2016 with a negative whole-body scan.  He did not have the stimulated thyroglobulin level drawn."  He underwent thyroid/neck ultrasound on October 02, 2018 in [place]  with no significant sonographic findings. -He presents with thyroid function test consistent with appropriate suppressive treatment.  His most recent Thyrogen stimulated whole-body scan on May 18, 2020 was significant for absence of tumor recurrence or distant metastasis. Thyroglobulin level was under 2 on May 15, 2020 associated with undetectable thyroglobulin bodies.  For his postsurgical hypothyroidism, he is currently on levo levothyroxine 175 mcg p.o. daily before breakfast.  He reports compliance to his medication, has no new complaints today.    His previsit thyroid function tests are consistent with appropriate replacement.    -Patient does not have any acute symptoms nor complaints today.  He does not feel any masses nor nodules in the neck.  He denies hoarseness, dysphagia, odynophagia.  He denies any family history of thyroid dysfunction nor thyroid malignancy.  Past Medical History:  Diagnosis Date   Thyroid cancer [facility])    Past Surgical History:  Procedure Laterality Date   TONSILLECTOMY     TOTAL THYROIDECTOMY     Social History   Socioeconomic History   Marital status: Married    Spouse name: Not on file   Number of children: Not on file   Years of education: Not on file   Highest education level: Not on file  Occupational History   Not on file  Tobacco Use   Smoking status: Never   Smokeless tobacco: Never  Vaping Use   Vaping Use: Never used  Substance and Sexual Activity   Alcohol use: Yes    Comment: Occassionally   Drug use: Never   Sexual activity: Not on file  Other Topics Concern   Not on file  Social History Narrative   Not on file   Social Determinants of Health   Financial Resource Strain: Not on file  Food Insecurity: Not on file   Transportation Needs: Not on file  Physical Activity: Not on file  Stress: Not on file  Social Connections: Not on file   Outpatient Encounter Medications as of 12/09/2020  Medication Sig   GLUCOSAMINE-CHONDROITIN PO Take by mouth daily in the afternoon.   Omega-3 Fatty Acids (FISH OIL OMEGA-3) 1000 MG CAPS Take 1 capsule by mouth daily in the afternoon.   cetirizine (ZYRTEC) 10 MG tablet Take 10 mg by mouth daily. (Patient not taking: Reported on 12/09/2020)   levothyroxine (SYNTHROID) 175 MCG tablet TAKE 1 TABLET BY MOUTH DAILY BEFORE BREAKFAST   [DISCONTINUED] levothyroxine (SYNTHROID) 175 MCG tablet TAKE 1 TABLET BY MOUTH DAILY BEFORE BREAKFAST   No facility-administered encounter medications on file as of 12/09/2020.   ALLERGIES: Allergies  Allergen Reactions   Sulfa Antibiotics    Vancomycin    VACCINATION STATUS:  There is no immunization history on file for this patient.    ROS: Limited as above.  Physical Exam: BP 124/72   Pulse 76   Ht 6' (1.829 m)   Wt 238 lb (108 kg)   BMI 32.28 kg/m  Wt Readings from Last 3 Encounters:  12/09/20 238 lb (108 kg)  05/28/20 235 lb (106.6 kg)  11/29/19 239 lb 9.6 oz (108.7 kg)     Labs from January 24, 2017: TSH 1.52, free T4 2-0 3 (normal 0.9 3-1.7); thyroglobulin level ,0.01, thyroglobulin antibodies  <1.0  October 21, 2016 Thyrogen stimulated whole-body scan was negative for tumor recurrence.  ASSESSMENT: 1.  Postsurgical hypothyroidism 2.  History of thyroid malignancy   Thyroid/neck ultrasound on October 02, 2018: There is no residual thyroid tissue noted within the thyroid bed.  No suspicious masses are evident by ultrasound.  No significant sonographic findings. PLAN:    Patient with history of thyroid malignancy status post staged surgery in 2016, see above for details of his surgery and pathology from his referral package.    -The need for periodic surveillance with imaging studies is emphasized to him  and he agrees. -His recent  thyroid/neck ultrasound is negative for residual thyroid tissue or suspicious masses.   -His previsit thyroid estimated whole-body scan is negative for tumor recurrence or distant metastasis.  His thyroglobulin levels are undetectable along with undetectable thyroglobulin bodies.  He has a habit of not showing for his appointments.  The need for strict follow-up for surveillance is emphasized 1 more time for him. He will be considered for 1 more imaging study after his next  visit in a year.  For his postsurgical hypothyroidism, his current dose of levothyroxine 175 mcg seems to be adequate and appropriate dose.   - We discussed about the correct intake of his thyroid hormone, on empty stomach at fasting, with water, separated by at least 30 minutes from breakfast and other medications,  and separated by more than 4 hours from calcium, iron, multivitamins, acid reflux medications (PPIs). -Patient is made aware of the fact that thyroid hormone replacement is needed for life, dose to be adjusted by periodic monitoring of thyroid function tests.  -He is advised to identify primary care provider in the area.   I spent 21 minutes in the care of the patient today including review of labs from Thyroid Function, CMP, and other relevant labs ; imaging/biopsy records (current and previous including abstractions from other facilities); face-to-face time discussing  his lab results and symptoms, medications doses, his options of short and long term treatment based on the latest standards of care / guidelines;   and documenting the encounter.  Leana Gamer  participated in the discussions, expressed understanding, and voiced agreement with the above plans.  All questions were answered to his satisfaction. he is encouraged to contact clinic should he have any questions or concerns prior to his return visit.    Return in about 1 year (around 12/09/2021) for F/U with Pre-visit  Labs.  Glade Lloyd, MD [facility] [address] [place], [place] [address] Phone: [phone]  Fax: [phone]   12/09/2020, 9:08 AM  This note was partially dictated with voice recognition software. Similar sounding words can be transcribed inadequately or may not  be corrected upon review.

## 2021-06-21 ENCOUNTER — Other Ambulatory Visit: Payer: Self-pay | Admitting: "Endocrinology

## 2021-06-21 DIAGNOSIS — E890 Postprocedural hypothyroidism: Secondary | ICD-10-CM

## 2021-11-10 ENCOUNTER — Telehealth: Payer: Self-pay | Admitting: "Endocrinology

## 2021-11-10 DIAGNOSIS — E890 Postprocedural hypothyroidism: Secondary | ICD-10-CM

## 2021-11-10 MED ORDER — LEVOTHYROXINE SODIUM 175 MCG PO TABS: ORAL_TABLET | ORAL | 0 refills | Status: DC

## 2021-11-10 NOTE — Telephone Encounter (Signed)
Pt requesting refill on her Thyroid Medication. Please send to [facility] on [address]

## 2021-11-10 NOTE — Telephone Encounter (Signed)
Rx sent 

## 2021-12-09 ENCOUNTER — Ambulatory Visit: Payer: BC Managed Care – PPO | Admitting: "Endocrinology

## 2022-01-05 ENCOUNTER — Other Ambulatory Visit: Payer: Self-pay | Admitting: "Endocrinology

## 2022-01-05 DIAGNOSIS — E890 Postprocedural hypothyroidism: Secondary | ICD-10-CM

## 2022-01-06 ENCOUNTER — Other Ambulatory Visit: Payer: Self-pay | Admitting: "Endocrinology

## 2022-01-07 LAB — TSH: TSH: 3.53 u[IU]/mL (ref 0.450–4.500)

## 2022-01-07 LAB — T4, FREE: Free T4: 1.56 ng/dL (ref 0.82–1.77)

## 2022-01-11 ENCOUNTER — Ambulatory Visit (INDEPENDENT_AMBULATORY_CARE_PROVIDER_SITE_OTHER): Payer: 59 | Admitting: "Endocrinology

## 2022-01-11 ENCOUNTER — Encounter: Payer: Self-pay | Admitting: "Endocrinology

## 2022-01-11 VITALS — BP 122/76 | HR 68 | Ht 75.0 in | Wt 240.4 lb

## 2022-01-11 DIAGNOSIS — E890 Postprocedural hypothyroidism: Secondary | ICD-10-CM

## 2022-01-11 DIAGNOSIS — Z85850 Personal history of malignant neoplasm of thyroid: Secondary | ICD-10-CM | POA: Diagnosis not present

## 2022-01-11 NOTE — Progress Notes (Signed)
01/11/2022, 4:04 PM    Endocrinology follow-up note    Barry Rose is a 32 y.o.-year-old male patient being seen in follow-up after he was seen in referral from another endocrinologist  for history of thyroid malignancy status post surgery and I-131 thyroid remnant ablation from 2016-2018, postsurgical hypothyroidism .  He was originally referred by his previous endocrinologist from [facility] due to his relocation to this area.  His referral package contains a report of his surgical history as follows:  " 1) papillary thyroid cancer: He was diagnosed with Graves' disease in 2016 and was found to have a thyroid nodule on the left superior pole as part of evaluation.  Fine-needle aspiration of this nodule was suspicious.  Papillary thyroid cancer. September 2016 he had a total thyroidectomy on September 23, 2014.  Pathology indicated that the left lobe had 1.5 cm papillary thyroid cancer; right lobe had a 0.1 cm Micro Focus of papillary thyroid cancer; 17/20 central neck lymph nodes had papillary thyroid cancer involvement.  The cancer was negative for BRAF mutation. - December 2016: He had I-123 uptake that were elevated at 16.4%, subsequent I-131 ablation was deferred. April 2017: On April 14, 2015, he had further surgery with resection of his residual right thyroid lobe.  Pathology was reported to be benign. - May 2017: On May 27, 2015, I 123 uptake was 1.7%.  He received Thyrogen for Thyrogen stimulated remnant ablation on the week of 06/05/2015 and received 50.4 mCi of I-131.    On June 12, 2015 he underwent a post remnant ablation whole-body scan that had focal uptake in the thyroid bed and involvement of his lingual thyroid tissue. - October 2018: In the week of October 17, 2016, he underwent Thyrogen stimulated whole-body scan done at an outside institute.  His maximum TSH  was 47.7 on October 18, 2016 with a negative whole-body scan.  He did not have the stimulated thyroglobulin level drawn."  He underwent thyroid/neck ultrasound on October 02, 2018 in [place]  with no significant sonographic findings. He has no new complaints today. -He presents with thyroid function test consistent with appropriate replacement therapy.    His most recent Thyrogen stimulated whole-body scan on May 18, 2020 was significant for absence of tumor recurrence or distant metastasis. Thyroglobulin level was under 2 on May 15, 2020 associated with undetectable thyroglobulin bodies.  For his postsurgical hypothyroidism, he is currently on levo levothyroxine 175 mcg p.o. daily before breakfast.  He reports very good compliance and consistency taking his medication.     -Patient does not have any acute symptoms nor complaints today.  He does not feel any masses nor nodules in the neck.  He denies hoarseness, dysphagia, odynophagia.  He denies any family history of thyroid dysfunction nor thyroid malignancy.  Past Medical History:  Diagnosis  Date   Thyroid cancer [facility])    Past Surgical History:  Procedure Laterality Date   TONSILLECTOMY     TOTAL THYROIDECTOMY     Social History   Socioeconomic History   Marital status: Married    Spouse name: Not on file   Number of children: Not on file   Years of education: Not on file   Highest education level: Not on file  Occupational History   Not on file  Tobacco Use   Smoking status: Never   Smokeless tobacco: Never  Vaping Use   Vaping Use: Never used  Substance and Sexual Activity   Alcohol use: Yes    Comment: Occassionally   Drug use: Never   Sexual activity: Not on file  Other Topics Concern   Not on file  Social History Narrative   Not on file   Social Determinants of Health   Financial Resource Strain: Not on file  Food Insecurity: Not on file  Transportation Needs: Not on file  Physical Activity: Not  on file  Stress: Not on file  Social Connections: Not on file   Outpatient Encounter Medications as of 01/11/2022  Medication Sig   cetirizine (ZYRTEC) 10 MG tablet Take 10 mg by mouth daily. (Patient not taking: Reported on 12/09/2020)   GLUCOSAMINE-CHONDROITIN PO Take by mouth daily in the afternoon.   levothyroxine (SYNTHROID) 175 MCG tablet TAKE 1 TABLET BY MOUTH EVERY DAY BEFORE BREAKFAST   Omega-3 Fatty Acids (FISH OIL OMEGA-3) 1000 MG CAPS Take 1 capsule by mouth daily in the afternoon.   No facility-administered encounter medications on file as of 01/11/2022.   ALLERGIES: Allergies  Allergen Reactions   Sulfa Antibiotics    Vancomycin    VACCINATION STATUS:  There is no immunization history on file for this patient.    ROS: Limited as above.  Physical Exam: BP 122/76   Pulse 68   Ht _0  (1.905 m)   Wt 240 lb 6.4 oz (109 kg)   BMI 30.05 kg/m  Wt Readings from Last 3 Encounters:  01/11/22 240 lb 6.4 oz (109 kg)  12/09/20 238 lb (108 kg)  05/28/20 235 lb (106.6 kg)     Labs from January 24, 2017: TSH 1.52, free T4 2-0 3 (normal 0.9 3-1.7); thyroglobulin level ,0.01, thyroglobulin antibodies  <1.0  October 21, 2016 Thyrogen stimulated whole-body scan was negative for tumor recurrence.  ASSESSMENT: 1.  Postsurgical hypothyroidism 2.  History of thyroid malignancy   Thyroid/neck ultrasound on October 02, 2018: There is no residual thyroid tissue noted within the thyroid bed.  No suspicious masses are evident by ultrasound.  No significant sonographic findings. PLAN:    Patient with history of thyroid malignancy status post staged surgery in 2016, see above for details of his surgery and pathology from his referral package.    -The need for periodic surveillance with imaging studies is emphasized to him and he agrees. -His recent  thyroid/neck ultrasound is negative for residual thyroid tissue or suspicious masses.   -His previsit thyroid estimated  whole-body scan is negative for tumor recurrence or distant metastasis.  His thyroglobulin levels are undetectable along with undetectable thyroglobulin bodies.  He has a habit of not showing for his appointments.  The need for strict follow-up for surveillance is emphasized 1 more time for him. He will be considered for previsit thyroglobulin measurement before his next visit in a year.    For his postsurgical hypothyroidism, his current dose of levothyroxine 175 mcg  seems to be adequate and appropriate dose.  - We discussed about the correct intake of his thyroid hormone, on empty stomach at fasting, with water, separated by at least 30 minutes from breakfast and other medications,  and separated by more than 4 hours from calcium, iron, multivitamins, acid reflux medications (PPIs). -Patient is made aware of the fact that thyroid hormone replacement is needed for life, dose to be adjusted by periodic monitoring of thyroid function tests.   -He is advised to identify primary care provider in the area.  I spent 21 minutes in the care of the patient today including review of labs from Thyroid Function, CMP, and other relevant labs ; imaging/biopsy records (current and previous including abstractions from other facilities); face-to-face time discussing  his lab results and symptoms, medications doses, his options of short and long term treatment based on the latest standards of care / guidelines;   and documenting the encounter.  Leana Gamer  participated in the discussions, expressed understanding, and voiced agreement with the above plans.  All questions were answered to his satisfaction. he is encouraged to contact clinic should he have any questions or concerns prior to his return visit.    Return in about 1 year (around 01/12/2023) for Fasting Labs  in AM B4 8.  Glade Lloyd, MD [facility] [address] [place], [place] [address] Phone:  [phone]  Fax: [phone]   01/11/2022, 4:04 PM  This note was partially dictated with voice recognition software. Similar sounding words can be transcribed inadequately or may not  be corrected upon review.

## 2022-04-01 ENCOUNTER — Other Ambulatory Visit: Payer: Self-pay | Admitting: "Endocrinology

## 2022-04-01 DIAGNOSIS — E890 Postprocedural hypothyroidism: Secondary | ICD-10-CM

## 2022-06-28 ENCOUNTER — Other Ambulatory Visit: Payer: Self-pay | Admitting: "Endocrinology

## 2022-06-28 DIAGNOSIS — E890 Postprocedural hypothyroidism: Secondary | ICD-10-CM

## 2022-10-01 ENCOUNTER — Other Ambulatory Visit: Payer: Self-pay | Admitting: "Endocrinology

## 2022-10-01 DIAGNOSIS — E890 Postprocedural hypothyroidism: Secondary | ICD-10-CM

## 2022-12-22 ENCOUNTER — Ambulatory Visit: Payer: 59 | Admitting: Primary Care

## 2022-12-22 ENCOUNTER — Encounter: Payer: Self-pay | Admitting: Primary Care

## 2022-12-22 VITALS — BP 128/78 | HR 77 | Ht 75.0 in | Wt 210.4 lb

## 2022-12-22 DIAGNOSIS — R0683 Snoring: Secondary | ICD-10-CM | POA: Diagnosis not present

## 2022-12-22 NOTE — Patient Instructions (Addendum)
Sleep apnea is defined as period of 10 seconds or longer when you stop breathing at night. This can happen multiple times a night. Dx sleep apnea is when this occurs more than 5 times an hour.    Mild OSA 5-15 apneic events an hour Moderate OSA 15-30 apneic events an hour Severe OSA > 30 apneic events an hour   Untreated sleep apnea puts you at higher risk for cardiac arrhythmias, pulmonary HTN, stroke and diabetes  Treatment options include weight loss, side sleeping position, oral appliance, CPAP therapy or referral to ENT for possible surgical options    Recommendations: Focus on side sleeping position or elevate head with wedge pillow 30 degrees Work on weight loss efforts if able  Do not drive if experiencing excessive daytime sleepiness of fatigue    Orders: Home sleep study re: loud snoring    Follow-up: Please call to schedule follow-up 1-2 weeks after completing home sleep study to review results and treatment if needed (can be virtual)  Sleep Apnea  Sleep apnea is a condition that affects your breathing while you are sleeping. Your tongue or soft tissue in your throat may block the flow of air while you sleep. You may have shallow breathing or stop breathing for short periods of time. People with sleep apnea may snore loudly. There are three kinds of sleep apnea: Obstructive sleep apnea. This kind is caused by a blocked or collapsed airway. This is the most common. Central sleep apnea. This kind happens when the part of the brain that controls breathing does not send the correct signals to the muscles that control breathing. Mixed sleep apnea. This is a combination of obstructive and central sleep apnea. What are the causes? The most common cause of sleep apnea is a collapsed or blocked airway. What increases the risk? Being very overweight. Having family members with sleep apnea. Having a tongue or tonsils that are larger than normal. Having a small airway or jaw  problems. Being older. What are the signs or symptoms? Loud snoring. Restless sleep. Trouble staying asleep. Being sleepy or tired during the day. Waking up gasping or choking. Having a headache in the morning. Mood swings. Having a hard time remembering things and concentrating. How is this diagnosed? A medical history. A physical exam. A sleep study. This is also called a polysomnography test. This test is done at a sleep lab or in your home while you are sleeping. How is this treated? Treatment may include: Sleeping on your side. Losing weight if you're overweight. Wearing an oral appliance. This is a mouthpiece that moves your lower jaw forward. Using a positive airway pressure (PAP) device to keep your airways open while you sleep, such as: A continuous positive airway pressure (CPAP) device. This device gives forced air through a mask when you breathe out. This keeps your airways open. A bilevel positive airway pressure (BIPAP) device. This device gives forced air through a mask when you breathe in and when you breathe out to keep your airways open. Having surgery if other treatments do not work. If your sleep apnea is not treated, you may be at risk for: Heart failure. Heart attack. Stroke. Type 2 diabetes or a problem with your blood sugar called insulin resistance. Follow these instructions at home: Medicines Take your medicines only as told by your health care provider. Avoid alcohol, medicines to help you relax, and certain pain medicines. These may make sleep apnea worse. General instructions Do not smoke, vape, or use products  with nicotine or tobacco in them. If you need help quitting, talk with your provider. If you were given a PAP device to open your airway while you sleep, use it as told by your provider. If you're having surgery, make sure to tell your provider you have sleep apnea. You may need to bring your PAP device with you. Contact a health care provider  if: The PAP device that you were given to use during sleep bothers you or does not seem to be working. You do not feel better or you feel worse. Get help right away if: You have trouble breathing. You have chest pain. You have trouble talking. One side of your body feels weak. A part of your face is hanging down. These symptoms may be an emergency. Call 911 right away. Do not wait to see if the symptoms will go away. Do not drive yourself to the hospital. This information is not intended to replace advice given to you by your health care provider. Make sure you discuss any questions you have with your health care provider. Document Revised: 03/03/2022 Document Reviewed: 03/03/2022 Elsevier Patient Education  2024 [employer].

## 2022-12-22 NOTE — Progress Notes (Signed)
@Patient  ID: Barry Rose, male    DOB: 1990-11-23, 32 y.o.   MRN: [id number]  Chief Complaint  Patient presents with   Consult    Sleep consult-loud snoring, wakes up a lot    Referring provider: No ref. provider found  HPI: 32 year old male, never smoked.  Past medical history significant for history of thyroid cancer, postsurgical hypothyroidism.  12/22/2022 Discussed the use of AI scribe software for clinical note transcription with the patient, who gave verbal consent to proceed.  History of Present Illness   Patient self-referred for a sleep consultation due to concerns about snoring and frequent nocturnal awakenings. He reports restless sleep and a tendency to breathe through his mouth due to difficulty breathing through the nose, possibly related to a past deviated septum. Typical bedtime is between 9-11pm. It takes him no more than 5-62mins to fall asleep. He reports waking up five or more times per night and often feels tired during the day. He starts his day at 6am. He drives forklift at work, he has had no work related accidents. He has on occasion been close to fallings asleep while driving, requiring him to pull over. The patient has lost 25 pounds over the past two years. Denies any history of seizures, narcolepsy, or sleepwalking. The patient has no previous sleep studies and is not currently on CPAP or oxygen. There is a potential family history of sleep apnea on the maternal side.      Allergies  Allergen Reactions   Sulfa Antibiotics    Vancomycin     There is no immunization history for the selected administration types on file for this patient.  Past Medical History:  Diagnosis Date   Thyroid cancer (HCC)     Tobacco History: Social History   Tobacco Use  Smoking Status Never  Smokeless Tobacco Never   Counseling given: Not Answered   Outpatient Medications Prior to Visit  Medication Sig Dispense Refill   GLUCOSAMINE-CHONDROITIN PO Take by mouth  daily in the afternoon.     levothyroxine (SYNTHROID) 175 MCG tablet TAKE 1 TABLET BY MOUTH EVERY DAY BEFORE BREAKFAST 90 tablet 1   Omega-3 Fatty Acids (FISH OIL OMEGA-3) 1000 MG CAPS Take 1 capsule by mouth daily in the afternoon.     cetirizine (ZYRTEC) 10 MG tablet Take 10 mg by mouth daily. (Patient not taking: Reported on 12/09/2020)     No facility-administered medications prior to visit.      Review of Systems  Review of Systems  Constitutional:  Positive for fatigue.  HENT: Negative.    Respiratory: Negative.    Cardiovascular: Negative.   Psychiatric/Behavioral:  Positive for sleep disturbance.      Physical Exam  BP 128/78 (BP Location: Left Arm, Cuff Size: Normal)   Pulse 77   Ht 6\' 3"  (1.905 m)   Wt 210 lb 6.4 oz (95.4 kg)   SpO2 98%   BMI 26.30 kg/m  Physical Exam Constitutional:      General: He is not in acute distress.    Appearance: Normal appearance. He is not ill-appearing.  HENT:     Head: Normocephalic and atraumatic.     Mouth/Throat:     Mouth: Mucous membranes are moist.     Pharynx: Oropharynx is clear.  Cardiovascular:     Rate and Rhythm: Normal rate and regular rhythm.  Pulmonary:     Effort: Pulmonary effort is normal.     Breath sounds: Normal breath sounds.  Musculoskeletal:  General: Normal range of motion.     Cervical back: Normal range of motion and neck supple.  Skin:    General: Skin is warm and dry.  Neurological:     General: No focal deficit present.     Mental Status: He is alert and oriented to person, place, and time. Mental status is at baseline.  Psychiatric:        Mood and Affect: Mood normal.        Behavior: Behavior normal.        Thought Content: Thought content normal.        Judgment: Judgment normal.      Lab Results:  CBC No results found for: "WBC", "RBC", "HGB", "HCT", "PLT", "MCV", "MCH", "MCHC", "RDW", "LYMPHSABS", "MONOABS", "EOSABS", "BASOSABS"  BMET No results found for: "NA", "K",  "CL", "CO2", "GLUCOSE", "BUN", "CREATININE", "CALCIUM", "GFRNONAA", "GFRAA"  BNP No results found for: "BNP"  ProBNP No results found for: "PROBNP"  Imaging: No results found.   Assessment & Plan:   1. Loud snoring (Primary) - Home sleep test; Future  Suspected Obstructive Sleep Apnea Reports of snoring, frequent nocturnal awakenings, and daytime sleepiness. Epworth Sleepiness Scale score of 15 indicating moderate daytime sleepiness. History of deviated septum and mouth breathing during sleep. Family history of sleep apnea on maternal side. Discussed risks of untreated sleep apnea including cardiac arrhythmias, pulmonary hypertension, diabetes and stroke.  We also discussed briefly treatment options including weight loss, oral appliance, CPAP therapy referral to ENT for possible surgical options. -Order home sleep study through [facility]. -Advised against driving if experiencing excessive daytime sleepiness or fatigue.  Encourage side sleeping position and avoid alcohol prior to bedtime as this can worsen underlying sleep apnea  General Health Maintenance -Encourage patient to sign up for MyChart for easy access to results and communication with the healthcare team.      Glenford Bayley, NP 12/22/2022

## 2023-01-13 ENCOUNTER — Ambulatory Visit: Payer: 59 | Admitting: "Endocrinology

## 2023-01-14 DIAGNOSIS — R0683 Snoring: Secondary | ICD-10-CM

## 2023-03-18 ENCOUNTER — Other Ambulatory Visit: Payer: Self-pay | Admitting: "Endocrinology

## 2023-03-18 DIAGNOSIS — E890 Postprocedural hypothyroidism: Secondary | ICD-10-CM

## 2023-08-09 ENCOUNTER — Ambulatory Visit: Payer: Self-pay | Admitting: Primary Care

## 2023-08-10 NOTE — Progress Notes (Signed)
 I called and spoke to pt. Pt informed of Beth's note and verbalized understanding. Routing to the front desk for scheduling. NFN

## 2023-08-10 NOTE — Telephone Encounter (Signed)
 Attempted to call patient. Left voicemail for patient to give our office a call back.

## 2023-08-23 ENCOUNTER — Encounter: Payer: Self-pay | Admitting: Primary Care

## 2023-08-23 NOTE — Progress Notes (Signed)
 No answer from patient, I have sent letter.

## 2023-11-25 ENCOUNTER — Other Ambulatory Visit: Payer: Self-pay | Admitting: "Endocrinology

## 2023-11-25 DIAGNOSIS — E890 Postprocedural hypothyroidism: Secondary | ICD-10-CM
